# Patient Record
Sex: Male | Born: 1980 | Race: White | Hispanic: No | Marital: Single | Smoking: Current every day smoker
Health system: Southern US, Community
[De-identification: ages and names within clinical notes are randomized; demographics above are authoritative.]

## PROBLEM LIST (undated history)

## (undated) DIAGNOSIS — G459 Transient cerebral ischemic attack, unspecified: Secondary | ICD-10-CM

## (undated) HISTORY — DX: Transient cerebral ischemic attack, unspecified: G45.9

## (undated) HISTORY — PX: WISDOM TOOTH EXTRACTION: SHX21

---
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
## 2002-05-28 ENCOUNTER — Emergency Department (HOSPITAL_COMMUNITY): Admission: EM | Admit: 2002-05-28 | Discharge: 2002-05-28 | Payer: Self-pay | Admitting: Emergency Medicine

## 2002-05-29 ENCOUNTER — Encounter: Payer: Self-pay | Admitting: Emergency Medicine

## 2002-11-23 ENCOUNTER — Emergency Department (HOSPITAL_COMMUNITY): Admission: EM | Admit: 2002-11-23 | Discharge: 2002-11-24 | Payer: Self-pay | Admitting: Emergency Medicine

## 2003-08-25 ENCOUNTER — Emergency Department (HOSPITAL_COMMUNITY): Admission: EM | Admit: 2003-08-25 | Discharge: 2003-08-25 | Payer: Self-pay | Admitting: Emergency Medicine

## 2010-03-11 ENCOUNTER — Encounter
Admission: RE | Admit: 2010-03-11 | Discharge: 2010-03-11 | Payer: Self-pay | Source: Home / Self Care | Attending: Family Medicine | Admitting: Family Medicine

## 2010-03-29 HISTORY — PX: KNEE SURGERY: SHX244

## 2011-03-14 ENCOUNTER — Ambulatory Visit (INDEPENDENT_AMBULATORY_CARE_PROVIDER_SITE_OTHER): Payer: BC Managed Care – PPO

## 2011-03-14 DIAGNOSIS — M549 Dorsalgia, unspecified: Secondary | ICD-10-CM

## 2011-03-14 DIAGNOSIS — R509 Fever, unspecified: Secondary | ICD-10-CM

## 2011-05-25 ENCOUNTER — Telehealth: Payer: Self-pay

## 2011-05-25 NOTE — Telephone Encounter (Signed)
[facility] to see if we could fax ekg if there is one and the last couple of ov's to them if possible to  [phone]

## 2011-12-01 ENCOUNTER — Ambulatory Visit: Payer: Self-pay | Admitting: Physician Assistant

## 2011-12-01 VITALS — BP 109/71 | HR 77 | Temp 98.4°F | Resp 16 | Ht 73.5 in | Wt 178.0 lb

## 2011-12-01 DIAGNOSIS — L301 Dyshidrosis [pompholyx]: Secondary | ICD-10-CM

## 2011-12-01 MED ORDER — METHYLPREDNISOLONE ACETATE 80 MG/ML IJ SUSP
80.0000 mg | Freq: Once | INTRAMUSCULAR | Status: AC
  Administered 2011-12-01: 80 mg via INTRAMUSCULAR

## 2011-12-01 MED ORDER — PREDNISONE 10 MG PO TABS: ORAL_TABLET | ORAL | Status: DC

## 2011-12-01 NOTE — Progress Notes (Signed)
  Subjective:    Patient ID: Victor Turner, male    DOB: March 14, 1981, 31 y.o.   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  He has had this for years and it always gets worse in the summer.  He works Therapist, music care and hands get really dry and then sweat more.  He uses steroid cream daily and uses Aquaphor and gloves at night when his hands get bad.  He has had to have prednisone about 6wks ago, this will be his 3rd time this year.  He does not get it anywhere else.  Review of Systems  Skin: Positive for rash.       Objective:   Physical Exam  Vitals reviewed. Constitutional: He is oriented to person, place, and time. He appears well-developed and well-nourished.  HENT:  Head: Normocephalic and atraumatic.  Right Ear: External ear normal.  Left Ear: External ear normal.  Pulmonary/Chest: Effort normal.  Neurological: He is alert and oriented to person, place, and time.  Skin: Skin is warm and dry. Rash noted. Rash is papular and vesicular.       B hands especially on palms and anterior wrists dyshidrotic eczema with vesicles, papules and scabbed area.  Psychiatric: He has a normal mood and affect. His behavior is normal. Judgment and thought content normal.          Assessment & Plan:   1. Dyshidrotic hand dermatitis  methylPREDNISolone acetate (DEPO-MEDROL) injection 80 mg, predniSONE (DELTASONE) 10 MG tablet   D/w pt the risks of multiple steroid use and he understands but states that this is the only thing that helps him.  He understands that nothing fixes this condition but it can be controlled with topical steroids and moisture to hands.  PT has diprolene at home that he should use nightly along with aquaphor.

## 2012-05-16 ENCOUNTER — Other Ambulatory Visit: Payer: Self-pay | Admitting: Family Medicine

## 2012-05-17 NOTE — Telephone Encounter (Signed)
Please pull chart.

## 2012-05-18 NOTE — Telephone Encounter (Signed)
Chart pulled to PA pool [id number]

## 2012-08-07 ENCOUNTER — Ambulatory Visit (INDEPENDENT_AMBULATORY_CARE_PROVIDER_SITE_OTHER): Payer: BC Managed Care – PPO | Admitting: Family Medicine

## 2012-08-07 ENCOUNTER — Ambulatory Visit: Payer: BC Managed Care – PPO

## 2012-08-07 VITALS — BP 130/74 | HR 66 | Temp 97.8°F | Resp 16 | Ht 72.5 in | Wt 168.2 lb

## 2012-08-07 DIAGNOSIS — T148XXA Other injury of unspecified body region, initial encounter: Secondary | ICD-10-CM

## 2012-08-07 DIAGNOSIS — L309 Dermatitis, unspecified: Secondary | ICD-10-CM

## 2012-08-07 DIAGNOSIS — M545 Low back pain, unspecified: Secondary | ICD-10-CM

## 2012-08-07 DIAGNOSIS — L259 Unspecified contact dermatitis, unspecified cause: Secondary | ICD-10-CM

## 2012-08-07 MED ORDER — PREDNISONE 10 MG PO TABS: ORAL_TABLET | ORAL | Status: DC

## 2012-08-07 MED ORDER — CYCLOBENZAPRINE HCL 10 MG PO TABS: 10.0000 mg | ORAL_TABLET | Freq: Every evening | ORAL | Status: DC | PRN

## 2012-08-07 MED ORDER — TRAMADOL HCL 50 MG PO TABS: 50.0000 mg | ORAL_TABLET | Freq: Three times a day (TID) | ORAL | Status: DC | PRN

## 2012-08-07 NOTE — Patient Instructions (Signed)
Back Exercises Back exercises help treat and prevent back injuries. The goal of back exercises is to increase the strength of your abdominal and back muscles and the flexibility of your back. These exercises should be started when you no longer have back pain. Back exercises include:  Pelvic Tilt. Lie on your back with your knees bent. Tilt your pelvis until the lower part of your back is against the floor. Hold this position 5 to 10 sec and repeat 5 to 10 times.  Knee to Chest. Pull first 1 knee up against your chest and hold for 20 to 30 seconds, repeat this with the other knee, and then both knees. This may be done with the other leg straight or bent, whichever feels better.  Sit-Ups or Curl-Ups. Bend your knees 90 degrees. Start with tilting your pelvis, and do a partial, slow sit-up, lifting your trunk only 30 to 45 degrees off the floor. Take at least 2 to 3 seconds for each sit-up. Do not do sit-ups with your knees out straight. If partial sit-ups are difficult, simply do the above but with only tightening your abdominal muscles and holding it as directed.  Hip-Lift. Lie on your back with your knees flexed 90 degrees. Push down with your feet and shoulders as you raise your hips a couple inches off the floor; hold for 10 seconds, repeat 5 to 10 times.  Back arches. Lie on your stomach, propping yourself up on bent elbows. Slowly press on your hands, causing an arch in your low back. Repeat 3 to 5 times. Any initial stiffness and discomfort should lessen with repetition over time.  Shoulder-Lifts. Lie face down with arms beside your body. Keep hips and torso pressed to floor as you slowly lift your head and shoulders off the floor. Do not overdo your exercises, especially in the beginning. Exercises may cause you some mild back discomfort which lasts for a few minutes; however, if the pain is more severe, or lasts for more than 15 minutes, do not continue exercises until you see your caregiver.  Improvement with exercise therapy for back problems is slow.  See your caregivers for assistance with developing a proper back exercise program. Document Released: 04/22/2004 Document Revised: 06/07/2011 Document Reviewed: 01/14/2011 [employer] Patient Information 2013 [employer].  
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
## 2012-09-29 ENCOUNTER — Encounter (HOSPITAL_BASED_OUTPATIENT_CLINIC_OR_DEPARTMENT_OTHER): Payer: Self-pay | Admitting: *Deleted

## 2012-09-29 ENCOUNTER — Emergency Department (HOSPITAL_BASED_OUTPATIENT_CLINIC_OR_DEPARTMENT_OTHER): Payer: BC Managed Care – PPO

## 2012-09-29 ENCOUNTER — Emergency Department (HOSPITAL_BASED_OUTPATIENT_CLINIC_OR_DEPARTMENT_OTHER)
Admission: EM | Admit: 2012-09-29 | Discharge: 2012-09-29 | Disposition: A | Discharge status: Home / Self Care | Payer: BC Managed Care – PPO | Attending: Emergency Medicine | Admitting: Emergency Medicine

## 2012-09-29 DIAGNOSIS — Y939 Activity, unspecified: Secondary | ICD-10-CM | POA: Insufficient documentation

## 2012-09-29 DIAGNOSIS — X58XXXA Exposure to other specified factors, initial encounter: Secondary | ICD-10-CM | POA: Insufficient documentation

## 2012-09-29 DIAGNOSIS — Z79899 Other long term (current) drug therapy: Secondary | ICD-10-CM | POA: Insufficient documentation

## 2012-09-29 DIAGNOSIS — S93602A Unspecified sprain of left foot, initial encounter: Secondary | ICD-10-CM

## 2012-09-29 DIAGNOSIS — Z8673 Personal history of transient ischemic attack (TIA), and cerebral infarction without residual deficits: Secondary | ICD-10-CM | POA: Insufficient documentation

## 2012-09-29 DIAGNOSIS — S93609A Unspecified sprain of unspecified foot, initial encounter: Secondary | ICD-10-CM | POA: Insufficient documentation

## 2012-09-29 DIAGNOSIS — F17200 Nicotine dependence, unspecified, uncomplicated: Secondary | ICD-10-CM | POA: Insufficient documentation

## 2012-09-29 DIAGNOSIS — Y929 Unspecified place or not applicable: Secondary | ICD-10-CM | POA: Insufficient documentation

## 2012-09-29 MED ORDER — MELOXICAM 7.5 MG PO TABS: 7.5000 mg | ORAL_TABLET | Freq: Every day | ORAL | Status: DC

## 2012-09-29 MED ORDER — KETOROLAC TROMETHAMINE 60 MG/2ML IM SOLN
60.0000 mg | Freq: Once | INTRAMUSCULAR | Status: AC
  Administered 2012-09-29: 60 mg via INTRAMUSCULAR
  Filled 2012-09-29: qty 2
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
## 2012-09-29 NOTE — ED Notes (Signed)
Patient transported to X-ray 

## 2012-09-29 NOTE — ED Notes (Signed)
C/o severe left foot pain since 11pm- denies injury

## 2013-04-30 ENCOUNTER — Emergency Department (HOSPITAL_COMMUNITY): Payer: Self-pay

## 2013-04-30 ENCOUNTER — Emergency Department (HOSPITAL_COMMUNITY)
Admission: EM | Admit: 2013-04-30 | Discharge: 2013-04-30 | Disposition: A | Discharge status: Home / Self Care | Payer: Self-pay | Attending: Emergency Medicine | Admitting: Emergency Medicine

## 2013-04-30 ENCOUNTER — Encounter (HOSPITAL_COMMUNITY): Payer: Self-pay | Admitting: Emergency Medicine

## 2013-04-30 DIAGNOSIS — R110 Nausea: Secondary | ICD-10-CM | POA: Insufficient documentation

## 2013-04-30 DIAGNOSIS — R002 Palpitations: Secondary | ICD-10-CM | POA: Insufficient documentation

## 2013-04-30 DIAGNOSIS — Z8673 Personal history of transient ischemic attack (TIA), and cerebral infarction without residual deficits: Secondary | ICD-10-CM | POA: Insufficient documentation

## 2013-04-30 DIAGNOSIS — R079 Chest pain, unspecified: Secondary | ICD-10-CM

## 2013-04-30 DIAGNOSIS — R0789 Other chest pain: Secondary | ICD-10-CM | POA: Insufficient documentation

## 2013-04-30 DIAGNOSIS — F17200 Nicotine dependence, unspecified, uncomplicated: Secondary | ICD-10-CM | POA: Insufficient documentation

## 2013-04-30 LAB — CBC
HCT: 46.5 % (ref 39.0–52.0)
Hemoglobin: 16.3 g/dL (ref 13.0–17.0)
MCH: 31.7 pg (ref 26.0–34.0)
MCHC: 35.1 g/dL (ref 30.0–36.0)
MCV: 90.5 fL (ref 78.0–100.0)
Platelets: 180 10*3/uL (ref 150–400)
RBC: 5.14 MIL/uL (ref 4.22–5.81)
RDW: 12.4 % (ref 11.5–15.5)
WBC: 8.9 10*3/uL (ref 4.0–10.5)

## 2013-04-30 LAB — BASIC METABOLIC PANEL
BUN: 15 mg/dL (ref 6–23)
CO2: 25 mEq/L (ref 19–32)
Calcium: 9.4 mg/dL (ref 8.4–10.5)
Chloride: 105 mEq/L (ref 96–112)
Creatinine, Ser: 0.92 mg/dL (ref 0.50–1.35)
GFR calc Af Amer: >90 mL/min (ref >90)
GFR calc non Af Amer: >90 mL/min (ref >90)
Glucose, Bld: 96 mg/dL (ref 70–99)
Potassium: 4 mEq/L (ref 3.7–5.3)
Sodium: 145 mEq/L (ref 137–147)

## 2013-04-30 LAB — TROPONIN I: Troponin I: <0.3 ng/mL (ref <0.30)

## 2013-04-30 MED ORDER — ASPIRIN 81 MG PO CHEW
324.0000 mg | CHEWABLE_TABLET | Freq: Once | ORAL | Status: AC
  Administered 2013-04-30: 324 mg via ORAL
  Filled 2013-04-30: qty 4

## 2013-04-30 NOTE — ED Provider Notes (Signed)
CSN: [id number]     Arrival date & time 04/30/13  1053 History   First MD Initiated Contact with Patient 04/30/13 1111     Chief Complaint  Patient presents with  . Chest Pain   (Consider location/radiation/quality/duration/timing/severity/associated sxs/prior Treatment) HPI Pt is a 33yo male with hx of TIA and strong family hx of CAD presenting today with left sided chest pain. Pt states he noticed some "fluttering" in his chest 2 days ago and admits to increased stress in his life but states last night he felt a heavy pressure in his chest associated with increased fluttering sensation.  Around 3am he felt a severe sharp pain in center of chest that radiated to left arm pit. Pain was so severe it caused him to jump out of bed, episode lasted about 10min. Pain associated with nausea but no vomiting. Now, pt states he still feels pressure in his chest, 2-3/10, has not taken medication PTA. Pt is not on any daily medication.  No personal hx of known CAD. Pt does smoke 0.5ppd for 19 years. Denies recent illness. Denies cough, fever, vomiting or diarrhea.   Past Medical History  Diagnosis Date  . TIA (transient ischemic attack)    Past Surgical History  Procedure Laterality Date  . Knee surgery  2012  . Wisdom tooth extraction     Family History  Problem Relation Age of Onset  . Hepatitis C Father   . Asthma Paternal Grandmother   . Cancer Paternal Grandfather    History  Substance Use Topics  . Smoking status: Current Every Day Smoker -- 0.50 packs/day for 19 years    Types: Cigarettes  . Smokeless tobacco: Never Used  . Alcohol Use: No     Comment: Former    Review of Systems  Constitutional: Negative for fever, chills, diaphoresis and fatigue.  Respiratory: Negative for cough and shortness of breath.   Cardiovascular: Positive for chest pain and palpitations. Negative for leg swelling.  Gastrointestinal: Positive for nausea. Negative for vomiting, abdominal pain and diarrhea.   Musculoskeletal: Negative for back pain.  All other systems reviewed and are negative.    Allergies  Review of patient's allergies indicates no known allergies.  Home Medications   Current Outpatient Rx  Name  Route  Sig  Dispense  Refill  . DimenhyDRINATE (DRAMAMINE PO)   Oral   Take 1-2 tablets by mouth 2 (two) times daily as needed (nausea).         Marland Kitchen. PRESCRIPTION MEDICATION   Oral   Take 0.5 tablets by mouth daily as needed (Nausea/vomiting). Anti nausea medication.          BP 114/68  Pulse 75  Temp(Src) 98.9 F (37.2 C) (Oral)  Resp 18  Ht 6\' 1"  (1.854 m)  Wt 165 lb (74.844 kg)  BMI 21.77 kg/m2  SpO2 100% Physical Exam  Nursing note and vitals reviewed. Constitutional: He appears well-developed and well-nourished.  Pt lying comfortably in exam bed, NAD.   HENT:  Head: Normocephalic and atraumatic.  Eyes: Conjunctivae are normal. No scleral icterus.  Neck: Normal range of motion. Neck supple.  Cardiovascular: Normal rate, regular rhythm and normal heart sounds.   Pulmonary/Chest: Effort normal and breath sounds normal. No respiratory distress. He has no wheezes. He has no rales. He exhibits no tenderness.  No respiratory distress, able to speak in full sentences w/o difficulty. Lungs: CTAB. No chest wall tenderness.   Abdominal: Soft. Bowel sounds are normal. He exhibits no distension and no  mass. There is no tenderness. There is no rebound and no guarding.  Musculoskeletal: Normal range of motion.  Neurological: He is alert.  Skin: Skin is warm and dry. No rash noted. No erythema.    ED Course  Procedures (including critical care time) Labs Review Labs Reviewed  CBC  BASIC METABOLIC PANEL  TROPONIN I   Imaging Review Dg Chest 2 View  04/30/2013   CLINICAL DATA:  Left chest pain  EXAM: CHEST  2 VIEW  COMPARISON:  DG CHEST 2 VIEW dated 08/25/2003  FINDINGS: The heart size and mediastinal contours are within normal limits. Both lungs are clear. The  visualized skeletal structures are unremarkable.  IMPRESSION: No active cardiopulmonary disease.   Electronically Signed   By: Elige Ko   On: 04/30/2013 13:02    EKG Interpretation   None       MDM   1. Chest pain    Pt c/o chest pain and palpitations that started Saturday, worsened last night. Pt does have hx of TIA and strong family hx for CAD.  Will get CP workup, some concern for MI.  PERC neg. No hx of COPD or asthma.  No hx of recent illness, low concern for pneumonia.   On exam: vitals- WNL, pt appears well, NAD.  Aspirin given in ED.  CBC and BMP: WNL Troponin: negative  CXR: unremarkable.  EKG: unremarkable.  Discussed pt with Dr. Juleen China, due to pain started Saturday, and gradually worsening last night, only 1 troponin needed at this time.  Do not believe further workup in ED for pt's chest pain is needed at this time. Strict return precautions given. Advised to f/u with PCP as well as cardiology due to pt's strong hx of CAD.  Pt verbalized understanding and agreement with tx plan.     Junius Finner, PA-C 04/30/13 1358

## 2013-04-30 NOTE — Discharge Instructions (Signed)
Today you're chest pain workup did not indicate any emergent heart disease such as a heart attack, however if you experience NEW or worsening symptoms, return to ER immediately for further evaluation.  Also, be sure to follow up with your primary care provider and let them know you were evaluated today in ED so they can obtain your records.  Follow up with a cardiologist for chest pain due to your strong family history of heart disease.  Chest Pain (Nonspecific) It is often hard to give a specific diagnosis for the cause of chest pain. There is always a chance that your pain could be related to something serious, such as a heart attack or a blood clot in the lungs. You need to follow up with your caregiver for further evaluation. CAUSES   Heartburn.  Pneumonia or bronchitis.  Anxiety or stress.  Inflammation around your heart (pericarditis) or lung (pleuritis or pleurisy).  A blood clot in the lung.  A collapsed lung (pneumothorax). It can develop suddenly on its own (spontaneous pneumothorax) or from injury (trauma) to the chest.  Shingles infection (herpes zoster virus). The chest wall is composed of bones, muscles, and cartilage. Any of these can be the source of the pain.  The bones can be bruised by injury.  The muscles or cartilage can be strained by coughing or overwork.  The cartilage can be affected by inflammation and become sore (costochondritis). DIAGNOSIS  Lab tests or other studies, such as X-rays, electrocardiography, stress testing, or cardiac imaging, may be needed to find the cause of your pain.  TREATMENT   Treatment depends on what may be causing your chest pain. Treatment may include:  Acid blockers for heartburn.  Anti-inflammatory medicine.  Pain medicine for inflammatory conditions.  Antibiotics if an infection is present.  You may be advised to change lifestyle habits. This includes stopping smoking and avoiding alcohol, caffeine, and chocolate.  You  may be advised to keep your head raised (elevated) when sleeping. This reduces the chance of acid going backward from your stomach into your esophagus.  Most of the time, nonspecific chest pain will improve within 2 to 3 days with rest and mild pain medicine. HOME CARE INSTRUCTIONS   If antibiotics were prescribed, take your antibiotics as directed. Finish them even if you start to feel better.  For the next few days, avoid physical activities that bring on chest pain. Continue physical activities as directed.  Do not smoke.  Avoid drinking alcohol.  Only take over-the-counter or prescription medicine for pain, discomfort, or fever as directed by your caregiver.  Follow your caregiver's suggestions for further testing if your chest pain does not go away.  Keep any follow-up appointments you made. If you do not go to an appointment, you could develop lasting (chronic) problems with pain. If there is any problem keeping an appointment, you must call to reschedule. SEEK MEDICAL CARE IF:   You think you are having problems from the medicine you are taking. Read your medicine instructions carefully.  Your chest pain does not go away, even after treatment.  You develop a rash with blisters on your chest. SEEK IMMEDIATE MEDICAL CARE IF:   You have increased chest pain or pain that spreads to your arm, neck, jaw, back, or abdomen.  You develop shortness of breath, an increasing cough, or you are coughing up blood.  You have severe back or abdominal pain, feel nauseous, or vomit.  You develop severe weakness, fainting, or chills.  You have  a fever. THIS IS AN EMERGENCY. Do not wait to see if the pain will go away. Get medical help at once. Call your local emergency services (911 in [place] Do not drive yourself to the hospital. MAKE SURE YOU:   Understand these instructions.  Will watch your condition.  Will get help right away if you are not doing well or get worse. Document  Released: 12/23/2004 Document Revised: 06/07/2011 Document Reviewed: 10/19/2007 [facility] Patient Information 2014 [place].

## 2013-04-30 NOTE — ED Notes (Signed)
Pt c/o left sided chest pain that radiates into left arm pit. Pt reports experienced fluttering sensation last night, sharp pain this morning about 3am. Pt reports intermittent tingling feeling to B/L hands. Pt has family cardiac history and history of TIA himself.

## 2013-05-06 NOTE — ED Provider Notes (Signed)
Medical screening examination/treatment/procedure(s) were performed by non-physician practitioner and as supervising physician I was immediately available for consultation/collaboration.  EKG Interpretation    Date/Time:  Monday April 30 2013 10:59:58 EST Ventricular Rate:  103 PR Interval:  122 QRS Duration: 78 QT Interval:  324 QTC Calculation: 424 R Axis:   86 Text Interpretation:  Sinus tachycardia Otherwise normal ECG ED PHYSICIAN INTERPRETATION AVAILABLE IN [facility] Confirmed by TEST, RECORD ([id number]) on 05/02/2013 7:16:54 AM             Raeford RazorStephen Edessa Jakubowicz, MD 05/06/13 [phone]

## 2013-09-03 IMAGING — CR DG FOOT COMPLETE 3+V*L*
3 series · 3 of 3 positions shown · non-contrast
Comparison: None.

CLINICAL DATA: Left foot pain.

LEFT FOOT - COMPLETE 3+ VIEW

[t foot ap left]
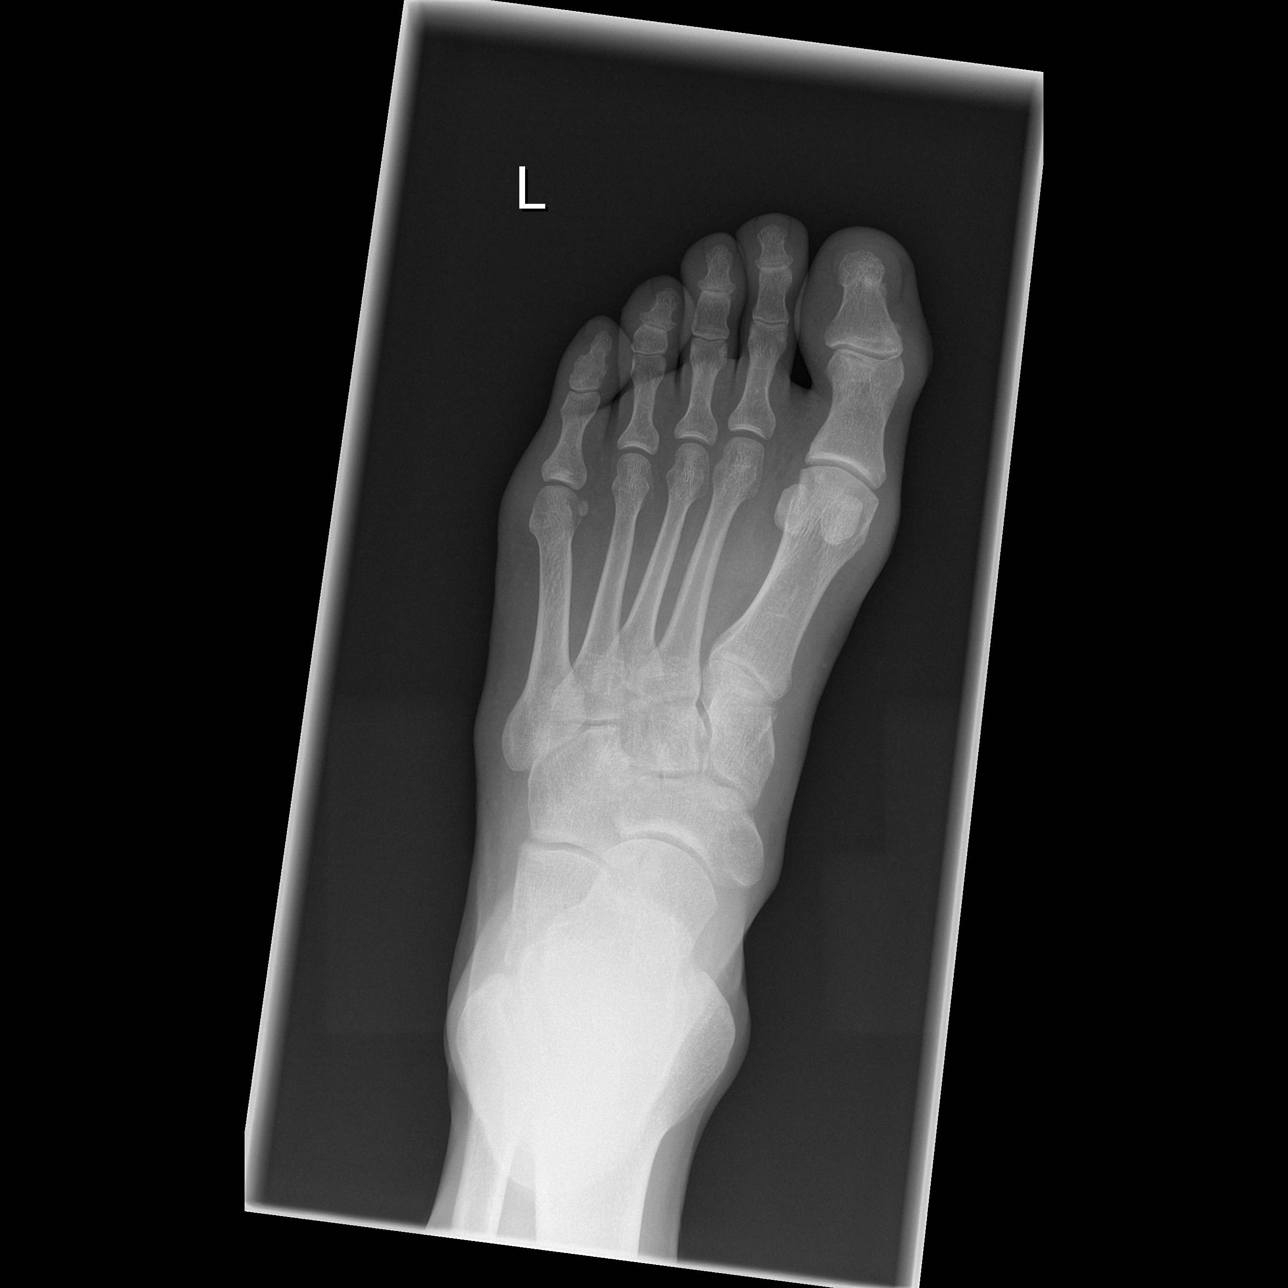

[t foot oblique left]
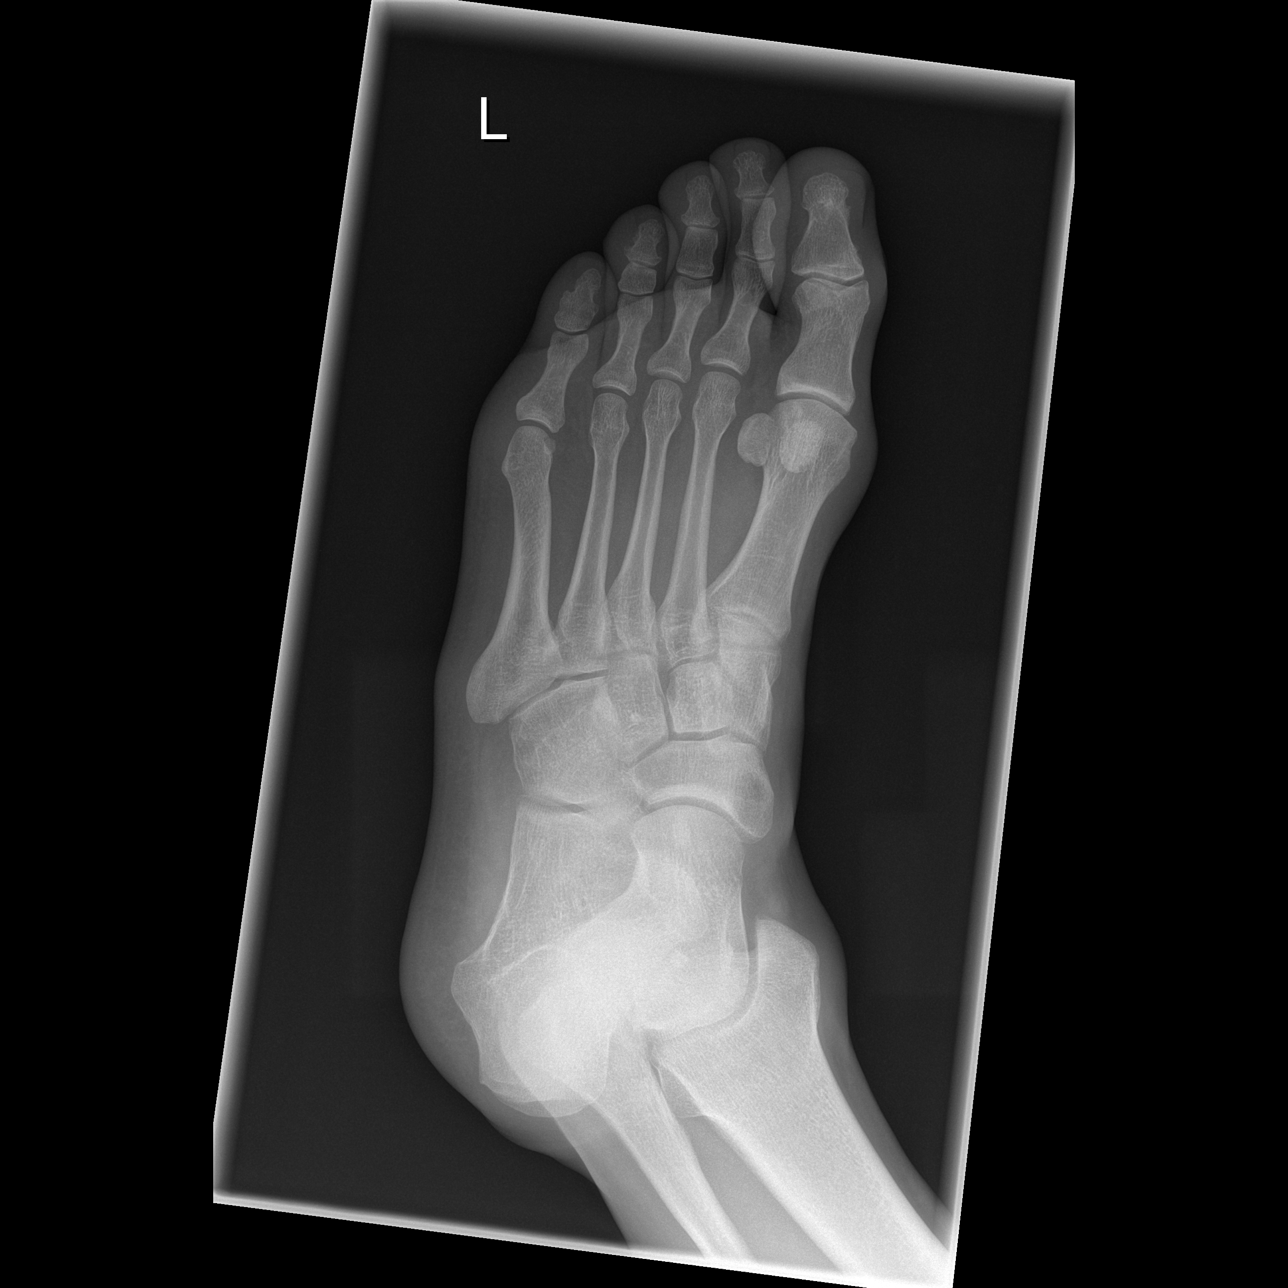

[t foot lat left]
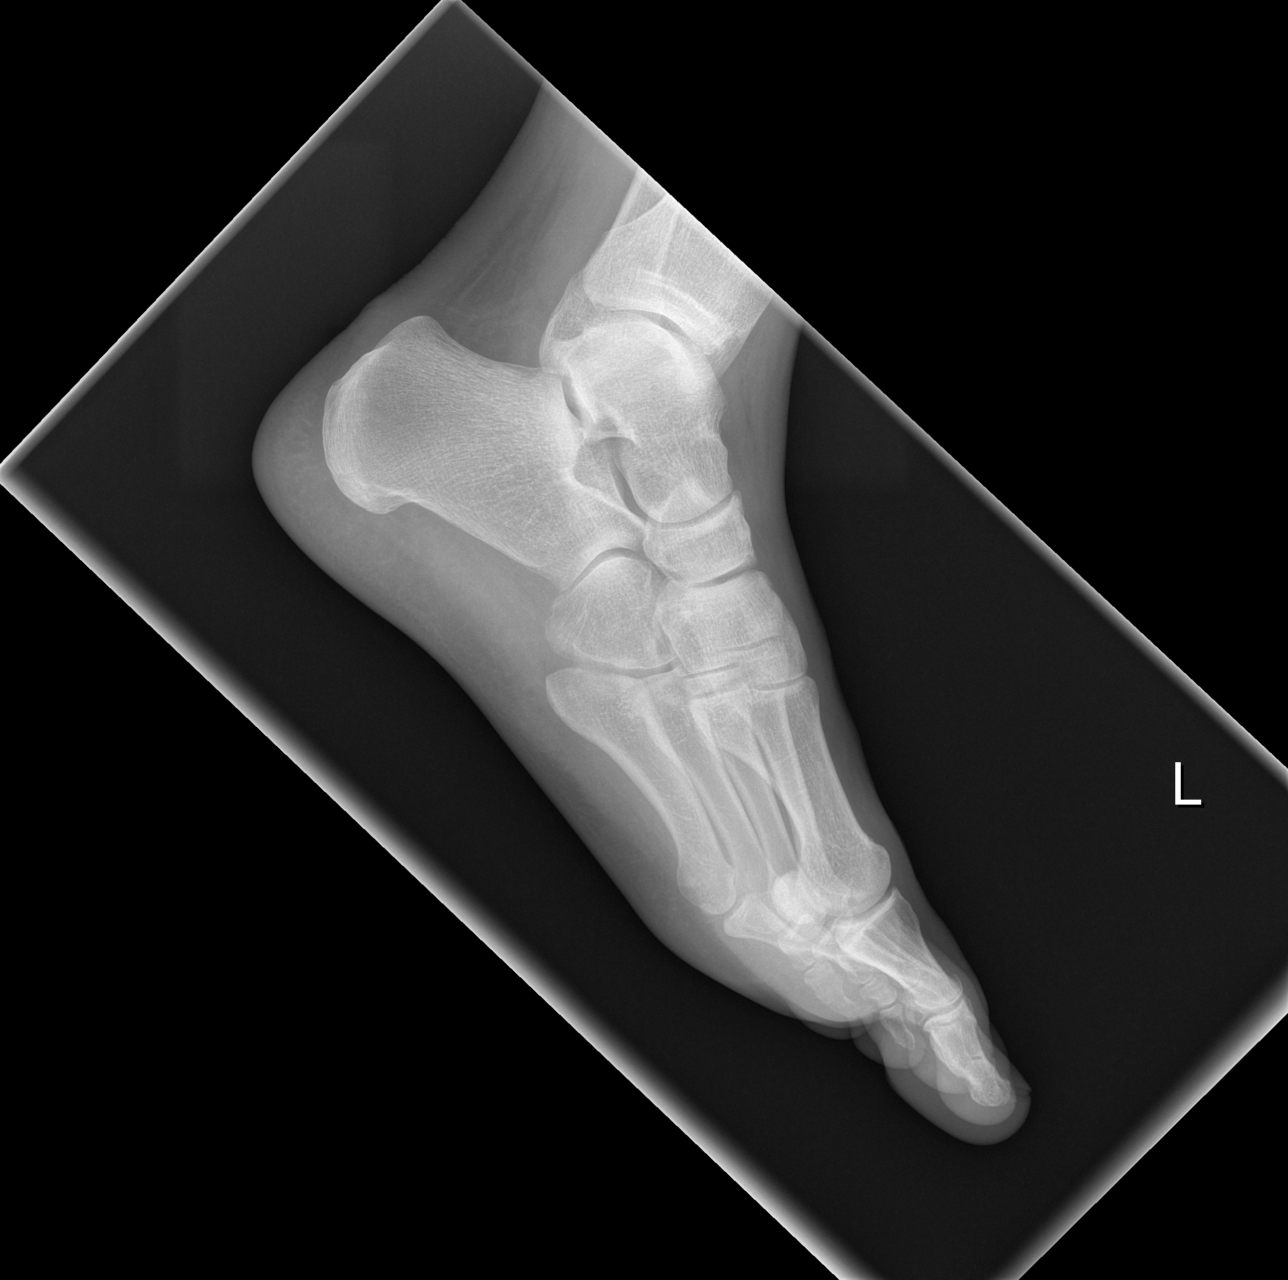

[3 of 3 positions shown; findings below may reference images not displayed]

FINDINGS: Anatomic alignment bones of the left foot.  There is no
fracture identified.  There is some soft tissue swelling along the
medial aspect of the great toe IP joint which is nonspecific.  No
radiopaque foreign body. The metatarsals appear intact.  No
periosteal reaction to suggest stress fracture.
IMPRESSION: No acute osseous abnormality.

## 2013-12-21 ENCOUNTER — Encounter (HOSPITAL_COMMUNITY): Payer: Self-pay | Admitting: Emergency Medicine

## 2013-12-21 ENCOUNTER — Emergency Department (HOSPITAL_COMMUNITY)
Admission: EM | Admit: 2013-12-21 | Discharge: 2013-12-21 | Disposition: A | Discharge status: Home / Self Care | Payer: Worker's Compensation | Attending: Emergency Medicine | Admitting: Emergency Medicine

## 2013-12-21 DIAGNOSIS — T23009A Burn of unspecified degree of unspecified hand, unspecified site, initial encounter: Secondary | ICD-10-CM | POA: Insufficient documentation

## 2013-12-21 DIAGNOSIS — Z8673 Personal history of transient ischemic attack (TIA), and cerebral infarction without residual deficits: Secondary | ICD-10-CM | POA: Insufficient documentation

## 2013-12-21 DIAGNOSIS — Y9289 Other specified places as the place of occurrence of the external cause: Secondary | ICD-10-CM | POA: Insufficient documentation

## 2013-12-21 DIAGNOSIS — Y9389 Activity, other specified: Secondary | ICD-10-CM | POA: Diagnosis not present

## 2013-12-21 DIAGNOSIS — X19XXXA Contact with other heat and hot substances, initial encounter: Secondary | ICD-10-CM | POA: Insufficient documentation

## 2013-12-21 DIAGNOSIS — F17200 Nicotine dependence, unspecified, uncomplicated: Secondary | ICD-10-CM | POA: Insufficient documentation

## 2013-12-21 DIAGNOSIS — Y990 Civilian activity done for income or pay: Secondary | ICD-10-CM | POA: Diagnosis not present

## 2013-12-21 DIAGNOSIS — Z79899 Other long term (current) drug therapy: Secondary | ICD-10-CM | POA: Diagnosis not present

## 2013-12-21 DIAGNOSIS — T23002A Burn of unspecified degree of left hand, unspecified site, initial encounter: Secondary | ICD-10-CM

## 2013-12-21 MED ORDER — SILVER SULFADIAZINE 1 % EX CREA
TOPICAL_CREAM | Freq: Once | CUTANEOUS | Status: AC
  Administered 2013-12-21: 20:00:00 via TOPICAL
  Filled 2013-12-21: qty 85
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
## 2013-12-21 NOTE — Discharge Instructions (Signed)
Please apply the silvadene twice a day.  Keep the area clean and covered.  Please follow-up with Dr. Kelly Splinter early next week.  Call her on Monday.  Be sure to say that you were seen in the ER.  Burn Care Your skin is a natural barrier to infection. It is the largest organ of your body. Burns damage this natural protection. To help prevent infection, it is very important to follow your caregiver's instructions in the care of your burn. Burns are classified as:  First degree. There is only redness of the skin (erythema). No scarring is expected.  Second degree. There is blistering of the skin. Scarring may occur with deeper burns.  Third degree. All layers of the skin are injured, and scarring is expected. HOME CARE INSTRUCTIONS   Wash your hands well before changing your bandage.  Change your bandage as often as directed by your caregiver.  Remove the old bandage. If the bandage sticks, you may soak it off with cool, clean water.  Cleanse the burn thoroughly but gently with mild soap and water.  Pat the area dry with a clean, dry cloth.  Apply a thin layer of antibacterial cream to the burn.  Apply a clean bandage as instructed by your caregiver.  Keep the bandage as clean and dry as possible.  Elevate the affected area for the first 24 hours, then as instructed by your caregiver.  Only take over-the-counter or prescription medicines for pain, discomfort, or fever as directed by your caregiver. SEEK IMMEDIATE MEDICAL CARE IF:   You develop excessive pain.  You develop redness, tenderness, swelling, or red streaks near the burn.  The burned area develops yellowish-white fluid (pus) or a bad smell.  You have a fever. MAKE SURE YOU:   Understand these instructions.  Will watch your condition.  Will get help right away if you are not doing well or get worse. Document Released: 03/15/2005 Document Revised: 06/07/2011 Document Reviewed: 08/05/2010 [facility] Patient  Information 2015 [place], [place]. This information is not intended to replace advice given to you by your health care provider. Make sure you discuss any questions you have with your health care provider.

## 2013-12-21 NOTE — ED Provider Notes (Signed)
CSN: [id number]     Arrival date & time 12/21/13  1911 History  This chart was scribed for non-physician practitioner working with Flint Melter, MD by Elveria Rising, ED Scribe. This patient was seen in room TR06C/TR06C and the patient's care was started at 8:02 PM.   Chief Complaint  Patient presents with  . Hand Burn   The history is provided by the patient. No language interpreter was used.   HPI Comments: Victor Turner is a 33 y.o. male who presents to the Emergency Department with chief complaint of burn to dorsal aspect of the left hand incurred last Saturday, six days ago while at work. Patient reports getting 325 deg asphalt on his hand. Patient reports blister formation after accident that managed to pop while at work the following day. Patient reports worsening, throbbing pain onset three days ago.  Patient states that he has kept the burn clean, covered and has been applying neosporin especially while at work.   Past Medical History  Diagnosis Date  . TIA (transient ischemic attack)    Past Surgical History  Procedure Laterality Date  . Knee surgery  2012  . Wisdom tooth extraction     Family History  Problem Relation Age of Onset  . Hepatitis C Father   . Asthma Paternal Grandmother   . Cancer Paternal Grandfather    History  Substance Use Topics  . Smoking status: Current Every Day Smoker -- 0.50 packs/day for 19 years    Types: Cigarettes  . Smokeless tobacco: Never Used  . Alcohol Use: No     Comment: Former    Review of Systems  Constitutional: Negative for fever and chills.  Skin: Positive for wound.       Burn    Allergies  Review of patient's allergies indicates no known allergies.  Home Medications   Prior to Admission medications   Medication Sig Start Date End Date Taking? Authorizing Provider  DimenhyDRINATE (DRAMAMINE PO) Take 1-2 tablets by mouth 2 (two) times daily as needed (nausea).    Historical Provider, MD  PRESCRIPTION MEDICATION  Take 0.5 tablets by mouth daily as needed (Nausea/vomiting). Anti nausea medication.    Historical Provider, MD   Triage Vitals: BP 125/79  Pulse 60  Temp(Src) 98.6 F (37 C) (Oral)  Resp 18  SpO2 97%  Physical Exam  Nursing note and vitals reviewed. Constitutional: He is oriented to person, place, and time. He appears well-developed and well-nourished. No distress.  HENT:  Head: Normocephalic and atraumatic.  Eyes: EOM are normal.  Neck: Neck supple.  Cardiovascular: Normal rate.   Intact distal pulses.   Pulmonary/Chest: Effort normal. No respiratory distress.  Musculoskeletal: Normal range of motion.  ROM and strength intact.  Neurological: He is alert and oriented to person, place, and time.  Sensation intact.  Skin: Skin is warm and dry.  Left hand: 3x3 cm partial thickness burn to posterir aspect of hand over second and third MCPs.   Psychiatric: He has a normal mood and affect. His behavior is normal.    ED Course  Procedures (including critical care time)  COORDINATION OF CARE: 8:07 PM- Will prescribe Silvadene cream and refer to hand specialist. Patient informed that his hand does not appear infected. Discussed treatment plan with patient at bedside and patient agreed to plan.   Labs Review Labs Reviewed - No data to display  Imaging Review No results found.   EKG Interpretation None      MDM  Final diagnoses:  Burn of hand, left, unspecified degree, initial encounter    Patient with burn to left hand as described. Maybe 1% BSA. No evidence of compartment syndrome. No evidence of infection.  No fevers.  Will give silvadene.  Recommend follow-up with Dr. Kelly Splinter.  Patient understands and agrees with the plan.  Tdap is updated/up to date.  I personally performed the services described in this documentation, which was scribed in my presence. The recorded information has been reviewed and is accurate.    Roxy Horseman, PA-C 12/21/13 2015

## 2013-12-21 NOTE — ED Provider Notes (Signed)
Medical screening examination/treatment/procedure(s) were performed by non-physician practitioner and as supervising physician I was immediately available for consultation/collaboration.  Acasia Skilton L Donterius Filley, MD 12/21/13 2319 

## 2014-04-04 IMAGING — CR DG CHEST 2V
2 series · 2 of 2 positions shown · non-contrast
Comparison: DG CHEST 2 VIEW dated 08/25/2003

CLINICAL DATA: Left chest pain

EXAM:
CHEST  2 VIEW

[w chest pa]
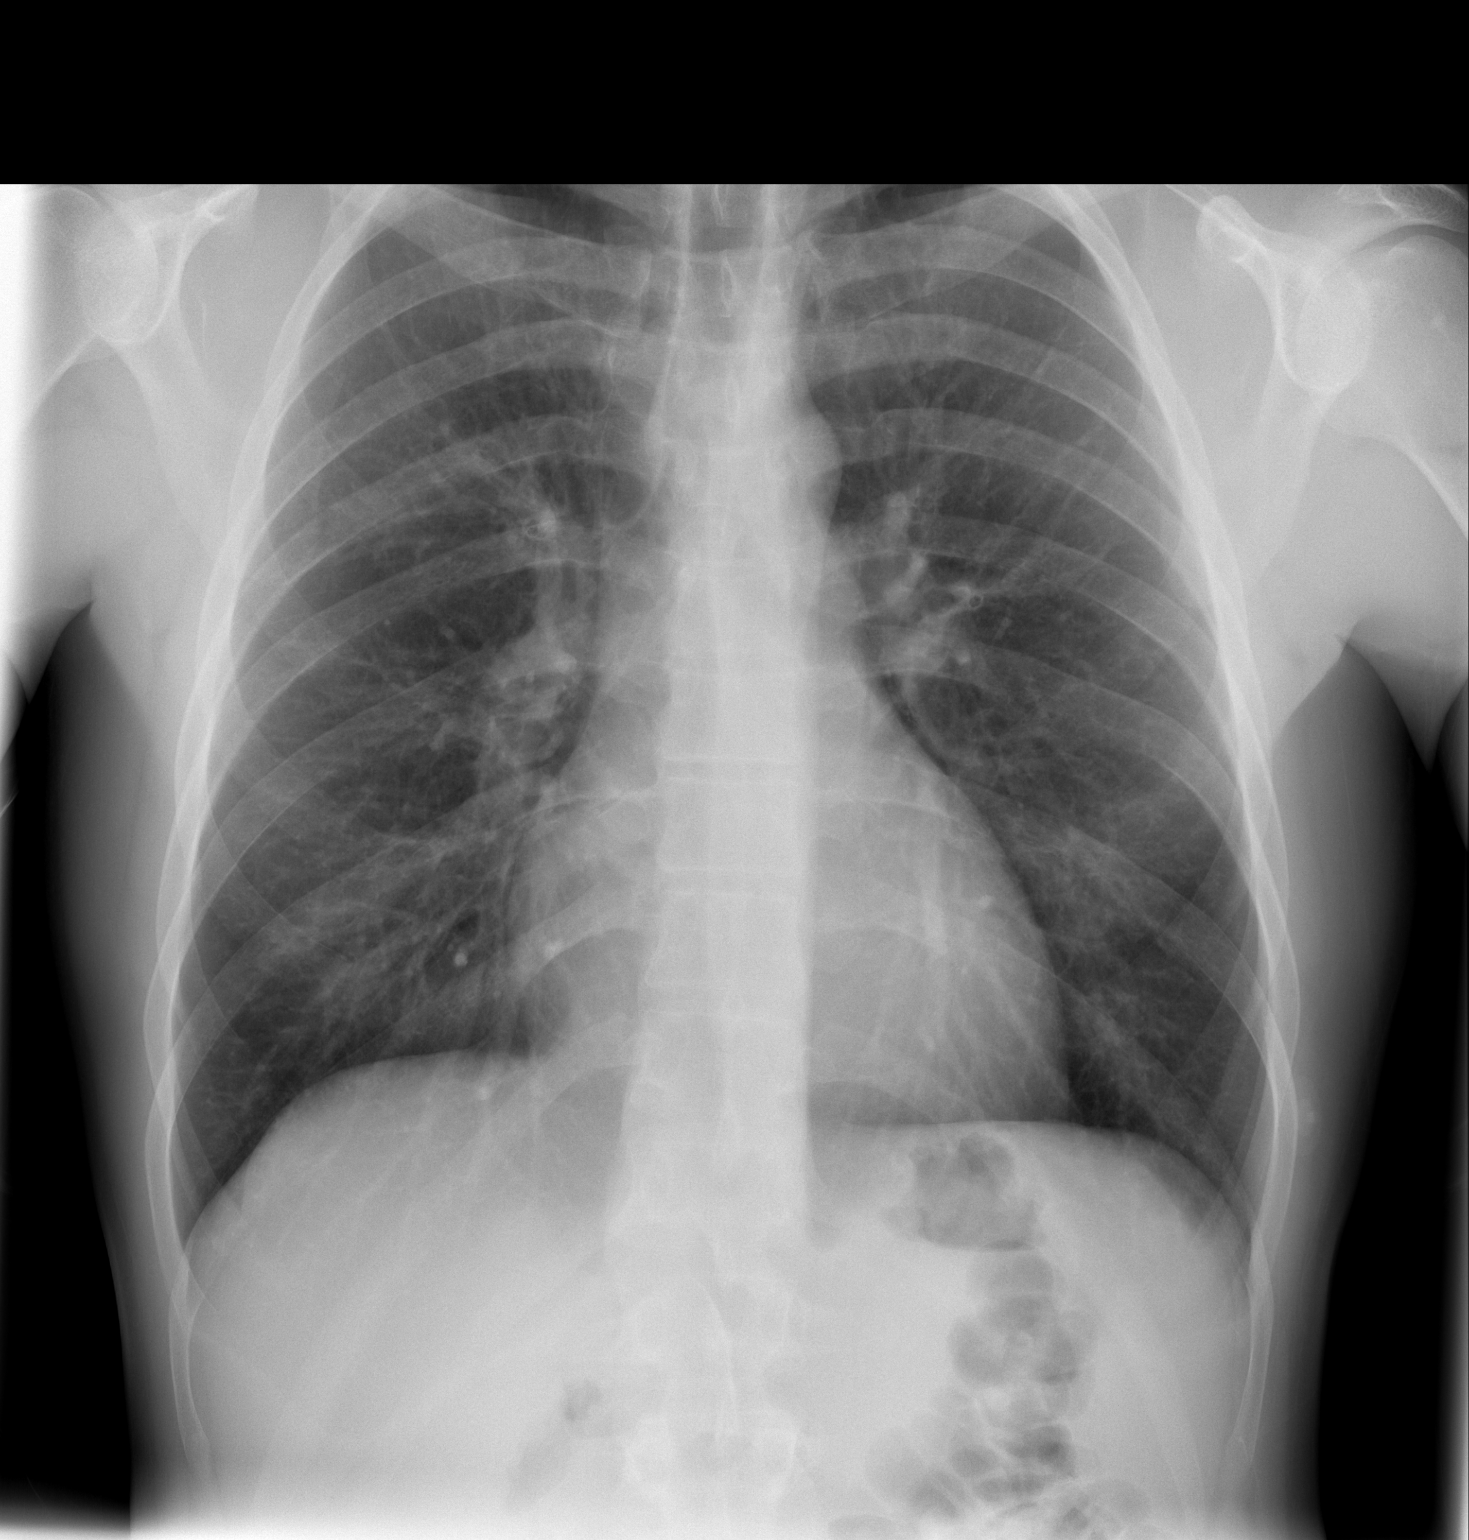

[w chest lat]
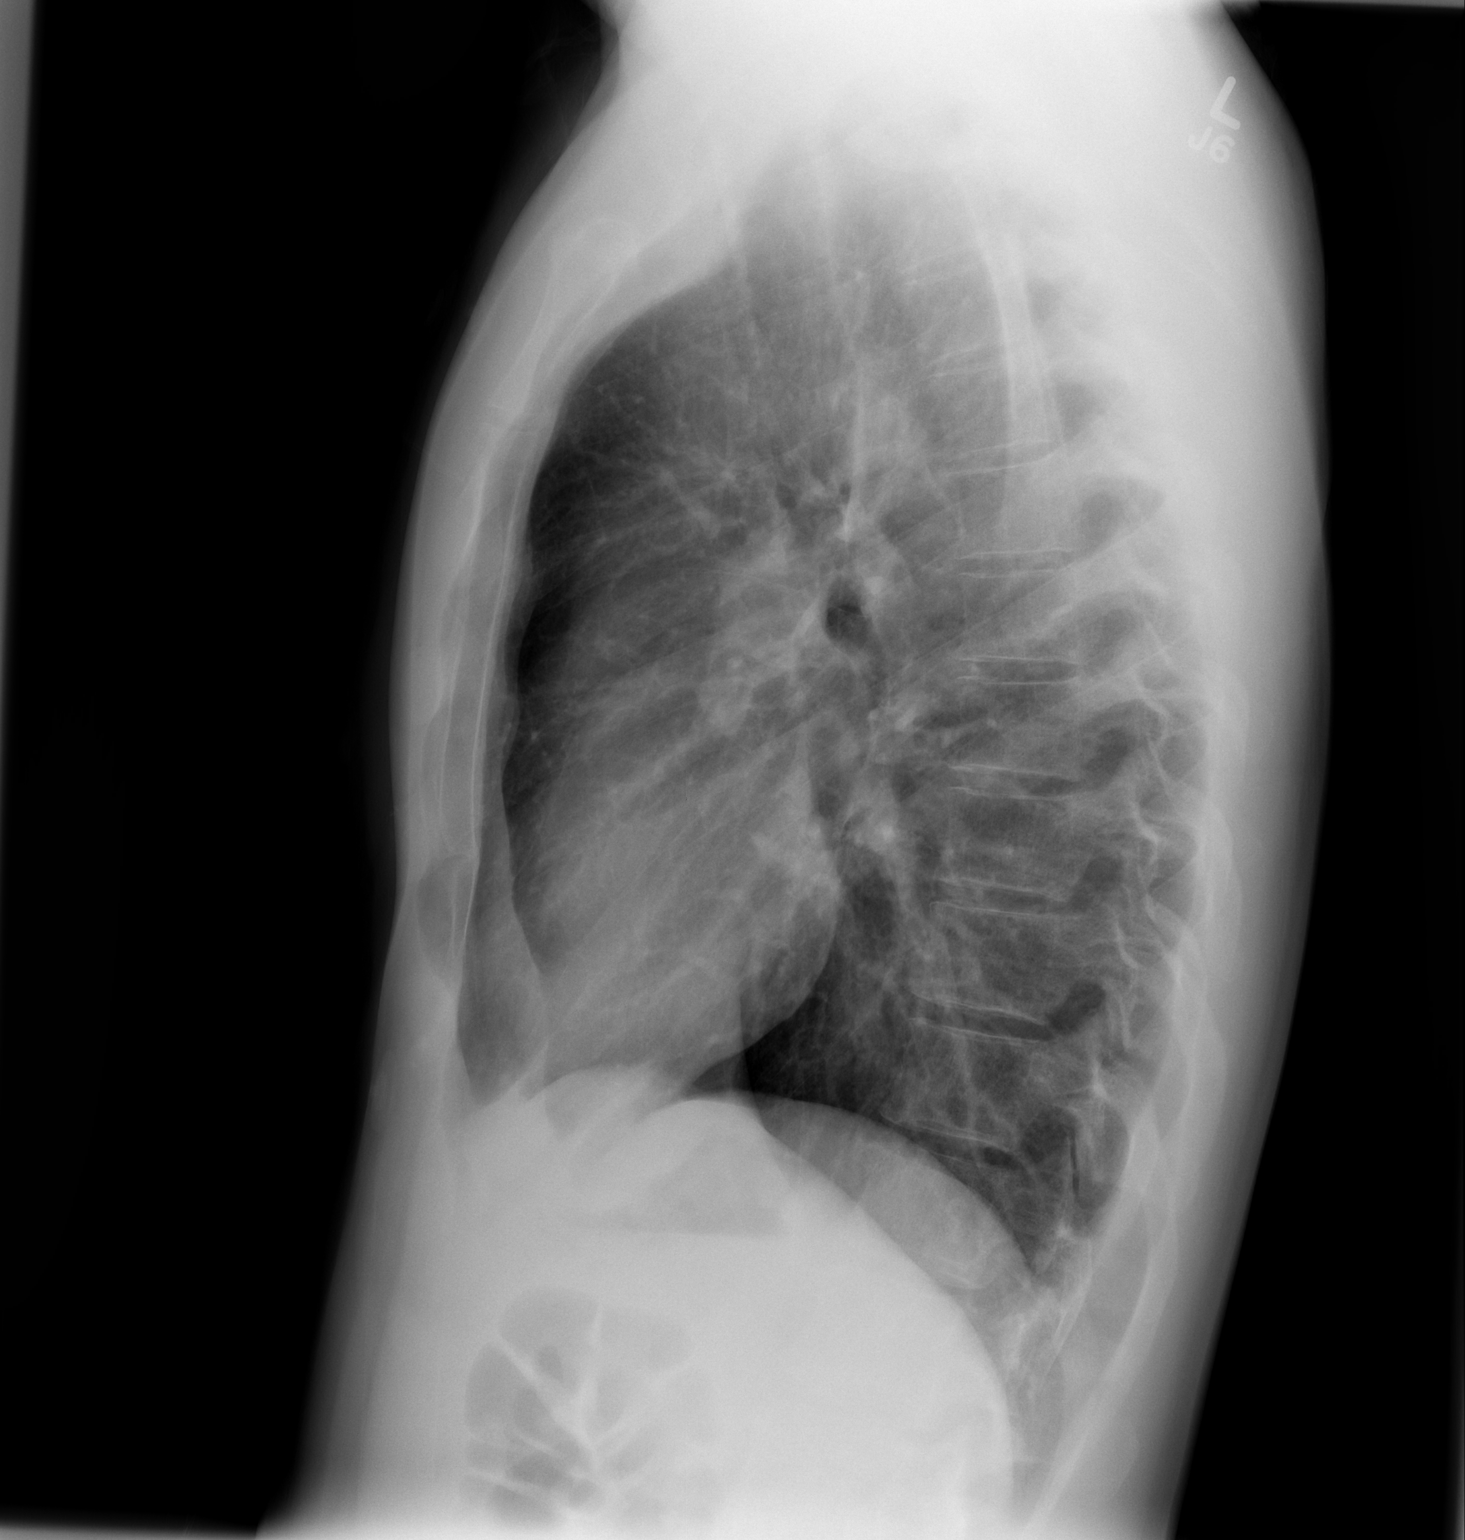

[2 of 2 positions shown; findings below may reference images not displayed]

FINDINGS: The heart size and mediastinal contours are within normal limits.
Both lungs are clear. The visualized skeletal structures are
unremarkable.
IMPRESSION: No active cardiopulmonary disease.

## 2016-03-30 ENCOUNTER — Emergency Department (HOSPITAL_COMMUNITY)
Admission: EM | Admit: 2016-03-30 | Discharge: 2016-03-30 | Disposition: A | Discharge status: Home / Self Care | Payer: Self-pay | Attending: Emergency Medicine | Admitting: Emergency Medicine

## 2016-03-30 ENCOUNTER — Encounter (HOSPITAL_COMMUNITY): Payer: Self-pay | Admitting: *Deleted

## 2016-03-30 ENCOUNTER — Emergency Department (HOSPITAL_COMMUNITY): Payer: Self-pay

## 2016-03-30 DIAGNOSIS — Z5321 Procedure and treatment not carried out due to patient leaving prior to being seen by health care provider: Secondary | ICD-10-CM | POA: Insufficient documentation

## 2016-03-30 DIAGNOSIS — R079 Chest pain, unspecified: Secondary | ICD-10-CM | POA: Insufficient documentation

## 2016-03-30 LAB — CBC
HCT: 43.6 % (ref 39.0–52.0)
HEMOGLOBIN: 14.8 g/dL (ref 13.0–17.0)
MCH: 30.8 pg (ref 26.0–34.0)
MCHC: 33.9 g/dL (ref 30.0–36.0)
MCV: 90.8 fL (ref 78.0–100.0)
PLATELETS: 177 10*3/uL (ref 150–400)
RBC: 4.8 MIL/uL (ref 4.22–5.81)
RDW: 13.2 % (ref 11.5–15.5)
WBC: 9.9 10*3/uL (ref 4.0–10.5)

## 2016-03-30 LAB — BASIC METABOLIC PANEL
ANION GAP: 12 (ref 5–15)
BUN: 14 mg/dL (ref 6–20)
CALCIUM: 9.2 mg/dL (ref 8.9–10.3)
CO2: 21 mmol/L — AB (ref 22–32)
CREATININE: 0.81 mg/dL (ref 0.61–1.24)
Chloride: 108 mmol/L (ref 101–111)
GFR calc Af Amer: >60 mL/min (ref >60)
Glucose, Bld: 120 mg/dL — ABNORMAL HIGH (ref 65–99)
Potassium: 3.8 mmol/L (ref 3.5–5.1)
SODIUM: 141 mmol/L (ref 135–145)

## 2016-03-30 LAB — I-STAT TROPONIN, ED: Troponin i, poc: 0 ng/mL (ref 0.00–0.08)

## 2016-03-30 NOTE — ED Triage Notes (Signed)
Pt has intermittent CP for years.  Has worn holter monitor which showed arrythmias.  Today it was more pressure than normal.

## 2016-03-30 NOTE — ED Notes (Signed)
Patient stated he has an appointment with his MD on Monday.  States he is feeling better,  Will return he starts feeling worse.

## 2017-03-04 IMAGING — DX DG CHEST 2V
2 series · 2 of 2 positions shown · non-contrast
Comparison: 04/30/2013 CXR

CLINICAL DATA: Intermittent chest pain with more chest pressure
that normal today.

EXAM:
CHEST  2 VIEW

[chest pa]
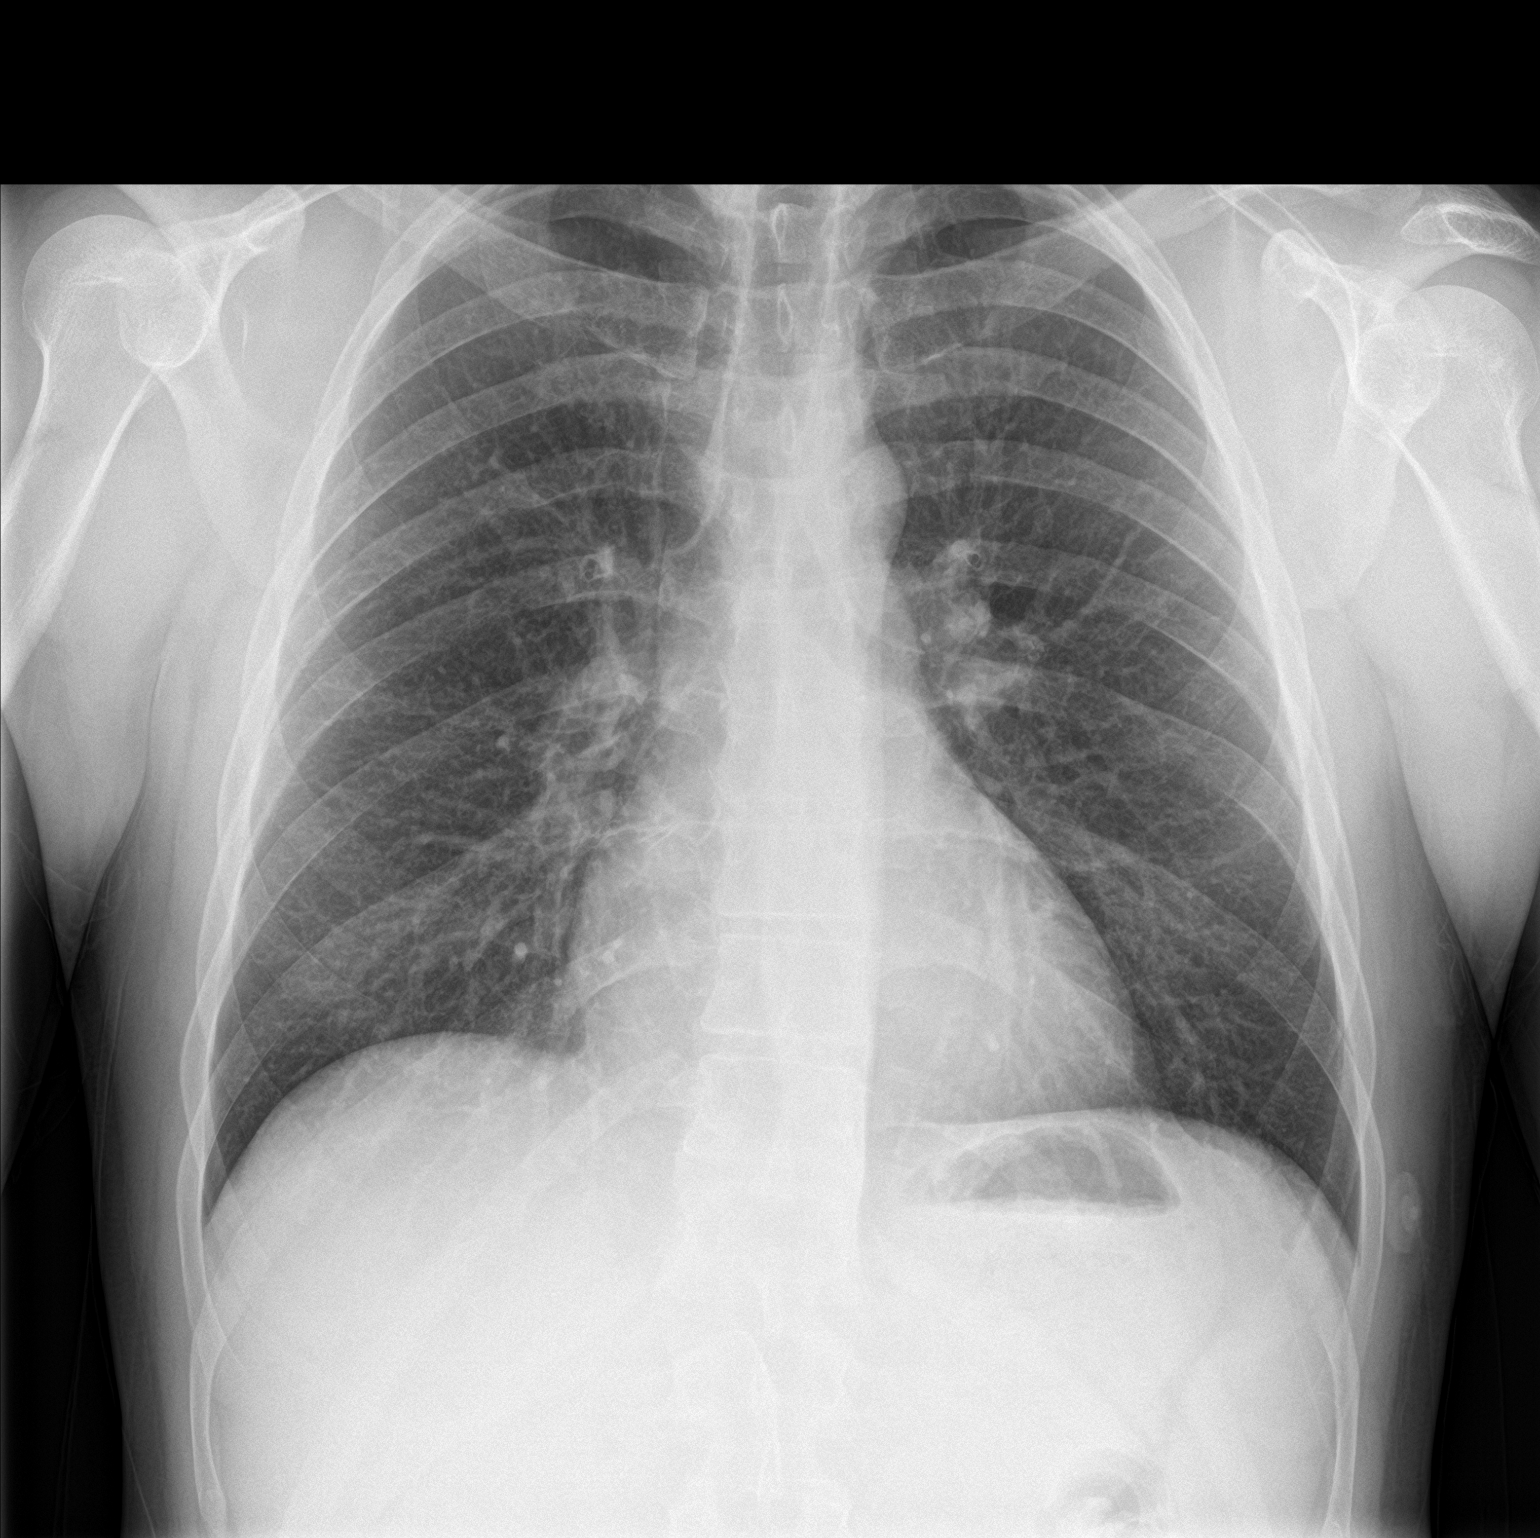

[chest lat]
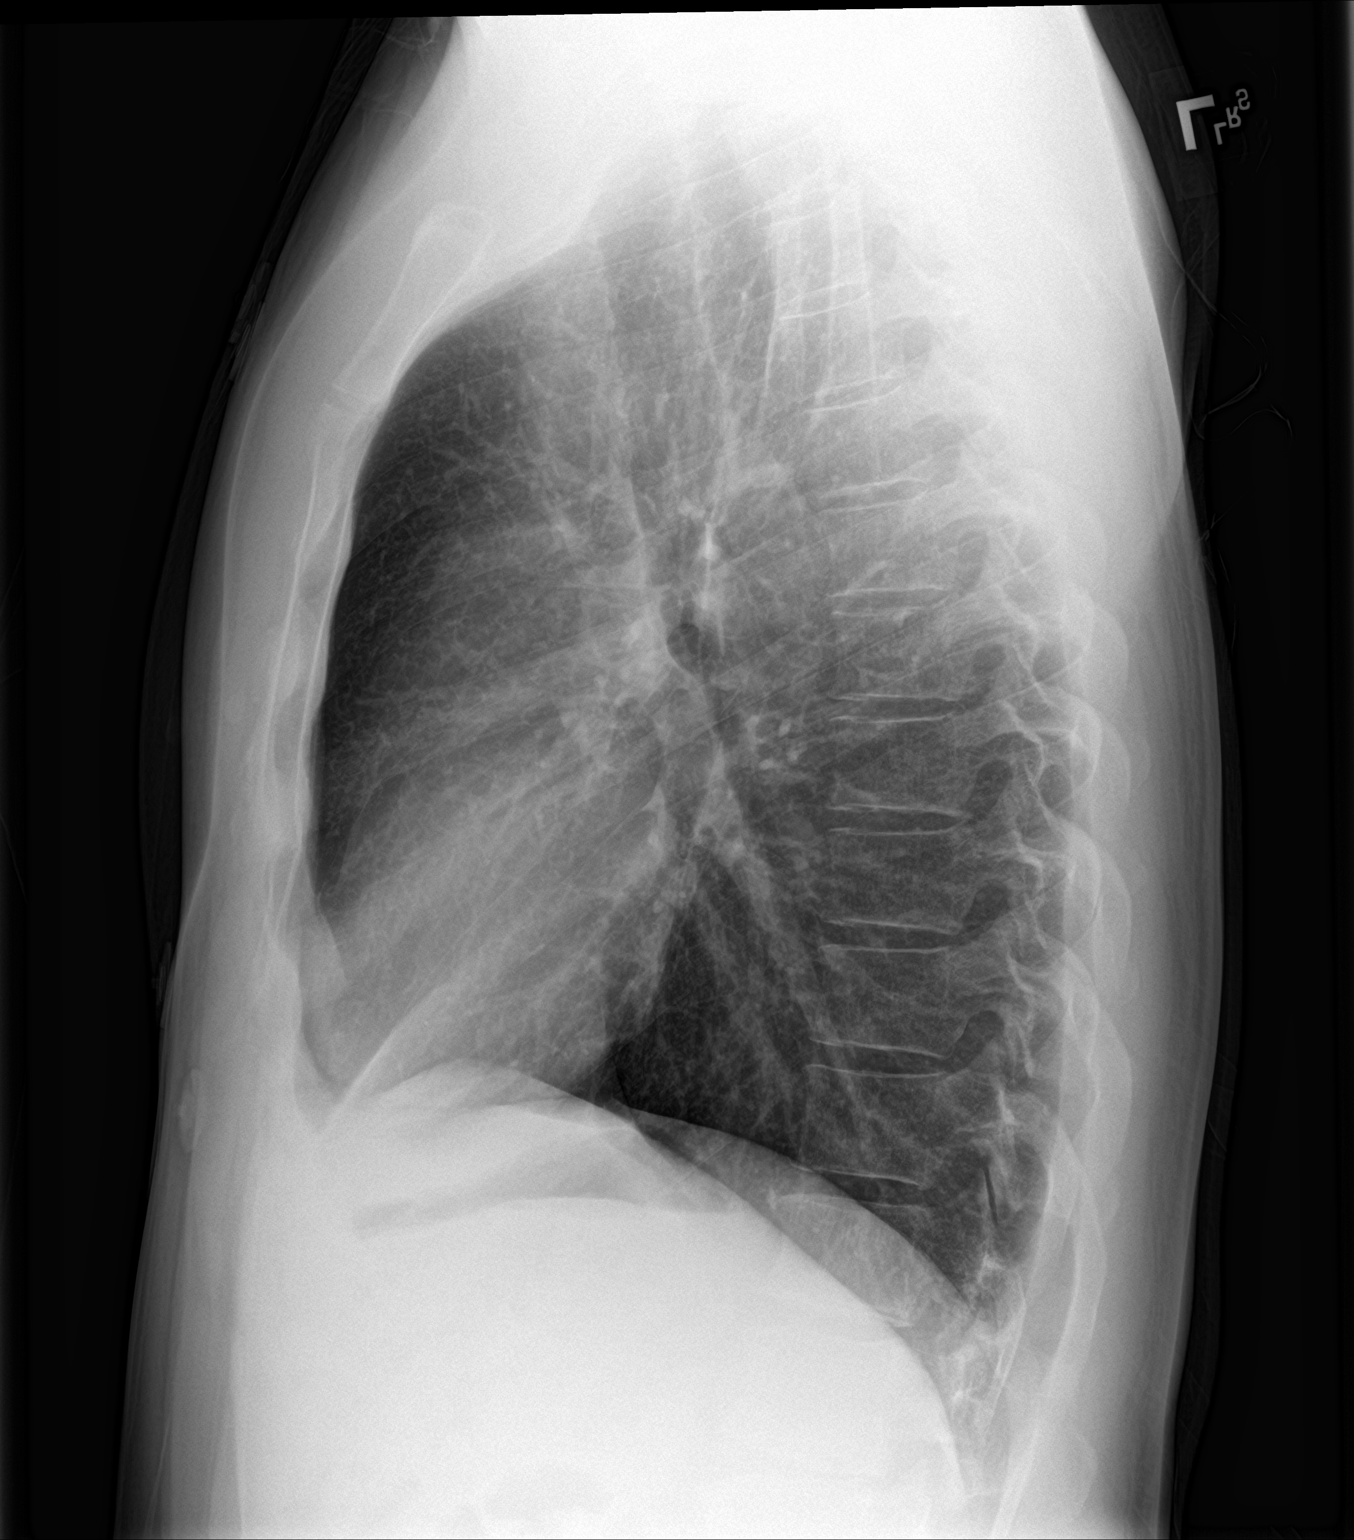

[2 of 2 positions shown; findings below may reference images not displayed]

FINDINGS: The heart size and mediastinal contours are within normal limits.
Both lungs are clear. The visualized skeletal structures are
unremarkable.
IMPRESSION: No active cardiopulmonary disease.
